# Patient Record
Sex: Female | Born: 1935 | Race: White | Hispanic: No
Health system: Southern US, Community
[De-identification: ages and names within clinical notes are randomized; demographics above are authoritative.]

---

## 2007-01-29 ENCOUNTER — Ambulatory Visit: Payer: Self-pay | Admitting: Internal Medicine

## 2007-01-29 ENCOUNTER — Ambulatory Visit: Payer: Self-pay

## 2007-02-02 ENCOUNTER — Ambulatory Visit: Payer: Self-pay | Admitting: Internal Medicine

## 2007-05-02 ENCOUNTER — Ambulatory Visit: Payer: Self-pay | Admitting: Unknown Physician Specialty

## 2007-09-28 ENCOUNTER — Ambulatory Visit: Payer: Self-pay | Admitting: Pain Medicine

## 2007-10-13 ENCOUNTER — Ambulatory Visit: Payer: Self-pay | Admitting: Pain Medicine

## 2007-10-27 ENCOUNTER — Ambulatory Visit: Payer: Self-pay | Admitting: Physician Assistant

## 2007-12-02 ENCOUNTER — Ambulatory Visit: Payer: Self-pay | Admitting: Family Medicine

## 2008-01-29 ENCOUNTER — Ambulatory Visit: Payer: Self-pay | Admitting: Cardiology

## 2008-03-25 ENCOUNTER — Ambulatory Visit: Payer: Self-pay | Admitting: Unknown Physician Specialty

## 2008-04-19 ENCOUNTER — Ambulatory Visit: Payer: Self-pay | Admitting: Physician Assistant

## 2008-04-28 ENCOUNTER — Ambulatory Visit: Payer: Self-pay | Admitting: Pain Medicine

## 2008-05-16 ENCOUNTER — Ambulatory Visit: Payer: Self-pay | Admitting: Pain Medicine

## 2008-09-06 ENCOUNTER — Ambulatory Visit: Payer: Self-pay | Admitting: Pain Medicine

## 2008-10-07 ENCOUNTER — Ambulatory Visit: Payer: Self-pay | Admitting: Family Medicine

## 2008-10-24 ENCOUNTER — Ambulatory Visit: Payer: Self-pay | Admitting: Family Medicine

## 2009-01-30 ENCOUNTER — Ambulatory Visit: Payer: Self-pay | Admitting: Family Medicine

## 2009-09-26 ENCOUNTER — Ambulatory Visit: Payer: Self-pay | Admitting: Family Medicine

## 2010-02-09 ENCOUNTER — Ambulatory Visit: Payer: Self-pay | Admitting: Family Medicine

## 2010-08-06 ENCOUNTER — Ambulatory Visit: Payer: Self-pay | Admitting: Gastroenterology

## 2010-08-09 LAB — PATHOLOGY REPORT

## 2010-09-11 ENCOUNTER — Ambulatory Visit: Payer: Self-pay | Admitting: Gastroenterology

## 2010-10-11 ENCOUNTER — Ambulatory Visit: Payer: Self-pay | Admitting: Surgery

## 2010-10-18 ENCOUNTER — Ambulatory Visit: Payer: Self-pay | Admitting: Surgery

## 2010-10-22 LAB — PATHOLOGY REPORT

## 2011-03-05 ENCOUNTER — Ambulatory Visit: Payer: Self-pay | Admitting: Family Medicine

## 2012-11-11 ENCOUNTER — Ambulatory Visit: Payer: Self-pay | Admitting: Family Medicine

## 2012-11-24 ENCOUNTER — Inpatient Hospital Stay: Payer: Self-pay | Admitting: Internal Medicine

## 2012-11-24 LAB — CBC WITH DIFFERENTIAL/PLATELET
Basophil #: 0 10*3/uL (ref 0.0–0.1)
Eosinophil #: 0 10*3/uL (ref 0.0–0.7)
Eosinophil %: 0 %
HCT: 39.1 % (ref 35.0–47.0)
Lymphocyte #: 0.8 10*3/uL — ABNORMAL LOW (ref 1.0–3.6)
Lymphocyte %: 8.4 %
MCH: 32.3 pg (ref 26.0–34.0)
MCHC: 34.5 g/dL (ref 32.0–36.0)
MCV: 94 fL (ref 80–100)
Monocyte #: 1 x10 3/mm — ABNORMAL HIGH (ref 0.2–0.9)
Neutrophil #: 7.4 10*3/uL — ABNORMAL HIGH (ref 1.4–6.5)
RBC: 4.18 10*6/uL (ref 3.80–5.20)
RDW: 13 % (ref 11.5–14.5)
WBC: 9.2 10*3/uL (ref 3.6–11.0)

## 2012-11-24 LAB — COMPREHENSIVE METABOLIC PANEL
Anion Gap: 12 (ref 7–16)
BUN: 26 mg/dL — ABNORMAL HIGH (ref 7–18)
Bilirubin,Total: 0.4 mg/dL (ref 0.2–1.0)
Calcium, Total: 9.2 mg/dL (ref 8.5–10.1)
Chloride: 101 mmol/L (ref 98–107)
EGFR (African American): 27 — ABNORMAL LOW
Potassium: 3.6 mmol/L (ref 3.5–5.1)
SGOT(AST): 22 U/L (ref 15–37)
Total Protein: 8.1 g/dL (ref 6.4–8.2)

## 2012-11-24 LAB — URINALYSIS, COMPLETE
Bilirubin,UR: NEGATIVE
Glucose,UR: NEGATIVE mg/dL (ref 0–75)
Hyaline Cast: 11
Leukocyte Esterase: NEGATIVE
Nitrite: NEGATIVE
Ph: 5 (ref 4.5–8.0)
RBC,UR: 2 /HPF (ref 0–5)
Specific Gravity: 1.019 (ref 1.003–1.030)
Squamous Epithelial: 2

## 2012-11-24 LAB — TROPONIN I: Troponin-I: <0.02 ng/mL

## 2012-11-24 LAB — LIPASE, BLOOD: Lipase: 86 U/L (ref 73–393)

## 2012-11-24 LAB — CLOSTRIDIUM DIFFICILE(ARMC)

## 2012-11-25 LAB — CBC WITH DIFFERENTIAL/PLATELET
Basophil #: 0 10*3/uL (ref 0.0–0.1)
Basophil %: 0.2 %
HGB: 11.7 g/dL — ABNORMAL LOW (ref 12.0–16.0)
Lymphocyte #: 0.9 10*3/uL — ABNORMAL LOW (ref 1.0–3.6)
Lymphocyte %: 12.3 %
MCH: 32.6 pg (ref 26.0–34.0)
MCHC: 35 g/dL (ref 32.0–36.0)
MCV: 93 fL (ref 80–100)
Monocyte #: 1 x10 3/mm — ABNORMAL HIGH (ref 0.2–0.9)
Neutrophil #: 5.4 10*3/uL (ref 1.4–6.5)
Neutrophil %: 73.6 %
Platelet: 236 10*3/uL (ref 150–440)
RBC: 3.6 10*6/uL — ABNORMAL LOW (ref 3.80–5.20)
RDW: 12.8 % (ref 11.5–14.5)
WBC: 7.4 10*3/uL (ref 3.6–11.0)

## 2012-11-25 LAB — BASIC METABOLIC PANEL
Anion Gap: 5 — ABNORMAL LOW (ref 7–16)
BUN: 20 mg/dL — ABNORMAL HIGH (ref 7–18)
Calcium, Total: 8.1 mg/dL — ABNORMAL LOW (ref 8.5–10.1)
Co2: 22 mmol/L (ref 21–32)
EGFR (African American): 44 — ABNORMAL LOW
EGFR (Non-African Amer.): 38 — ABNORMAL LOW
Glucose: 105 mg/dL — ABNORMAL HIGH (ref 65–99)
Osmolality: 279 (ref 275–301)

## 2012-11-25 LAB — URINE CULTURE

## 2012-11-26 LAB — BASIC METABOLIC PANEL
BUN: 10 mg/dL (ref 7–18)
Calcium, Total: 7.9 mg/dL — ABNORMAL LOW (ref 8.5–10.1)
Chloride: 114 mmol/L — ABNORMAL HIGH (ref 98–107)
Co2: 19 mmol/L — ABNORMAL LOW (ref 21–32)
EGFR (African American): 50 — ABNORMAL LOW
Glucose: 101 mg/dL — ABNORMAL HIGH (ref 65–99)
Osmolality: 280 (ref 275–301)
Potassium: 3.3 mmol/L — ABNORMAL LOW (ref 3.5–5.1)

## 2012-11-26 LAB — MAGNESIUM: Magnesium: 1.9 mg/dL

## 2012-11-27 LAB — BASIC METABOLIC PANEL
Anion Gap: 6 — ABNORMAL LOW (ref 7–16)
BUN: 8 mg/dL (ref 7–18)
Calcium, Total: 7.5 mg/dL — ABNORMAL LOW (ref 8.5–10.1)
Chloride: 117 mmol/L — ABNORMAL HIGH (ref 98–107)
Co2: 19 mmol/L — ABNORMAL LOW (ref 21–32)
Creatinine: 1 mg/dL (ref 0.60–1.30)
EGFR (African American): >60
Glucose: 97 mg/dL (ref 65–99)

## 2012-11-27 LAB — MAGNESIUM: Magnesium: 1.4 mg/dL — ABNORMAL LOW

## 2012-12-25 LAB — STOOL CULTURE

## 2013-06-06 IMAGING — NM NUCLEAR MEDICINE HEPATOHBILIARY INCLUDE GB
1 series · 9 of 9 positions shown · non-contrast
Comparison: none

REASON FOR EXAM: nausea  RUQ pain
COMMENTS:

[Series 1000: gallbladder statics · 4.80mm/px · 9 of 9 slices shown]
[im 1/9]
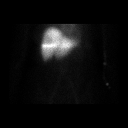
[im 2/9]
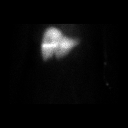
[im 3/9]
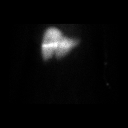
[im 4/9]
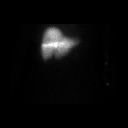
[im 5/9]
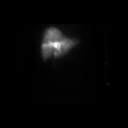
[im 6/9]
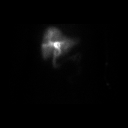
[im 7/9]
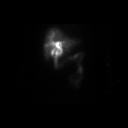
[im 8/9]
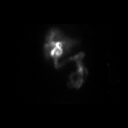
[im 9/9]
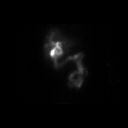

[9 of 9 positions shown; findings below may reference images not displayed]

PROCEDURE:     NM  - NM HEPATO WITH GB EJECT FRACTION  - September 11, 2010 [DATE]

RESULT:     Following intravenous administration of 8.36 mCi technetium 99m
Choletec, there is noted prompt visualization of tracer activity in the
liver at 3 minutes. Tracer activity is visualized in the gallbladder, common
duct and proximal small bowel at 40 minutes.
IMPRESSION: Normal hepatobiliary scan.

## 2013-12-15 ENCOUNTER — Ambulatory Visit: Payer: Self-pay | Admitting: Family Medicine

## 2014-05-06 NOTE — H&P (Signed)
PATIENT NAME:  Jill Green, Jill Green MR#:  [id number] DATE OF BIRTH:  06-27-1933  DATE OF ADMISSION:  11/24/2012  REFERRING PHYSICIAN: Rockne MenghiniAnne-Caroline Norman, MD  PRIMARY CARE PHYSICIAN: Jerl MinaJames Hedrick, MD  CHIEF COMPLAINT: Diarrhea and dehydration.   HISTORY OF PRESENT ILLNESS: The patient is a 81 year old female with past medical history of hypertension, hypothyroid and migraine in remote past who is living active and healthy life, retired, lives alone. Started having severe diarrhea 3 days ago and also associated with nausea and vomiting. The diarrhea is so severe that she said every 30 minutes she has to go to the bathroom and she also could not control it so she messed up her clothes and the bed. She is not able to eat anything for the last 2 days, not even the medication, Imodium, for her diarrhea, and so decided to come to Emergency Room today. In the ER, initially she was found having severe dehydration and acute renal failure so IV fluids were started. Her stool was checked for any C. diff, which was negative, so she is given as admission for severe dehydration, acute renal failure and diarrhea.   REVIEW OF SYSTEMS: CONSTITUTIONAL: Negative for fever and fatigue, but positive for generalized weakness. No pain or weight loss.  EYES: No blurring or double vision, discharge or redness.  EARS, NOSE, THROAT: No tinnitus, ear pain or hearing loss.  RESPIRATORY: No cough, wheezing, shortness of breath or hemoptysis.  CARDIOVASCULAR: No chest pain, orthopnea, edema or palpitations.  GASTROINTESTINAL: The patient has severe nausea, vomiting, diarrhea and abdominal discomfort, but no pain.  GENITOURINARY: The patient has had decreased urination for the last 2 days now and denies any increased frequency of burning in the urine.  SKIN: No rashes or lesions.  LEGS: No swelling.  NEUROLOGICAL: Denies any weakness, numbness or tremors.  PSYCHIATRIC: No insomnia or depression.  PAST MEDICAL HISTORY: Positive  for hypertension and hypothyroid.  PAST SURGICAL HISTORY: Two years ago gallbladder surgery which was laparoscopic and vaginal hysterectomy.   [place] and she said that her PMD has the report and there was not any significant finding.   SOCIAL HISTORY: Quit smoking 25 years ago. Drinks alcohol occasionally. No drugs. She is retired and lives independently in a condominium.  FAMILY HISTORY: Sister had some kidney cancer and brothers and sisters, some of them died. One of the brothers had some heart attacks at an early age.   PHYSICAL EXAMINATION: VITAL SIGNS: In the ER, temperature 98, pulse 100, respirations 18, blood pressure 106/63 and pulse ox 96 on room air.  GENERAL: The patient is fully alert and oriented to time, place and person. Does not appear in any acute distress.  HEENT: Head and neck atraumatic. Conjunctivae pink. Oral mucosa moist.  NECK: Supple. No JVD.  RESPIRATORY: Bilateral clear and equal air entry.  CARDIOVASCULAR: S1 and S2 present, regular. No murmur.  ABDOMEN: Soft, nontender. Bowel sounds present. No organomegaly. Mild discomfort present.  SKIN: No rashes.  LEGS: No edema.  NEUROLOGICAL: Power 5/5.  Follows commands. No tremor or rigidity.  PSYCHIATRIC: Does not appear in any acute psychiatric illness.   IMPORTANT LABORATORY AND DIAGNOSTIC RESULTS: A CT scan of the abdomen and pelvis shows negative for urinary tract stone. No acute finding or finding to explain the patient's symptoms. Simple hepatic cyst. Status post hysterectomy and cholecystectomy.  Glucose 111, BUN 26, creatinine 1.99, sodium 136, potassium 3.6, chloride 101 and CO2 23. Lipase is 86. Troponin  is less than 0.02. WBC 9.2, hemoglobin 13.5, platelet count 287. C. diff is negative. Urinalysis is grossly negative.   ASSESSMENT AND PLAN:  81.  A 81 year old female with past history of hypertension and hypothyroid who came to the Emergency Room with complaint of  severe diarrhea and intractable vomiting. Will give her IV Cipro and Flagyl and will do symptomatic management for vomiting and diarrhea. Will also call gastroenterology consult and will do stool culture. Clostridium difficile is negative.  2.  Dehydration and acute renal failure. We will give IV fluid and continue monitoring.  3.  Hypertension. Will hold blood pressure medication at this time because blood pressure is normal.  4.  Hypothyroidism. Will continue her Synthroid as she was taking and check TSH tomorrow morning.  TOTAL TIME SPENT ON THIS ADMISSION: 45 minutes. ____________________________ Hope Pigeon Elisabeth Pigeon, MD vgv:sb D: 11/24/2012 16:24:48 ET T: 11/24/2012 16:51:39 ET JOB#: [id number]  cc: Hope Pigeon. Elisabeth Pigeon, MD, <Dictator> Rhona Leavens. Burnett Sheng, MD Altamese Dilling MD ELECTRONICALLY SIGNED 12/14/2012 10:08

## 2014-05-06 NOTE — Discharge Summary (Signed)
PATIENT NAME:  Jill Green, Jill Green MR#:  [id number] DATE OF BIRTH:  [birth year]  DATE OF ADMISSION:  11/24/2012 DATE OF DISCHARGE:  11/27/2012  PRIMARY CARE PHYSICIAN: Rhona LeavensJames F. Burnett ShengHedrick, MD  DISCHARGE DIAGNOSES: 1.  Acute gastroenteritis, Salmonella infection.  2.  Acute renal failure.  3.  Dehydration.  4.  Hypokalemia.  5.  Hypomagnesemia.  6.  Hypertension.   CONDITION: Stable.   CODE STATUS: Full code.   HOME MEDICATIONS: Please refer to the [facility] physician discharge instruction medication reconciliation list.   DIET: Low-sodium diet.   ACTIVITY: As tolerated.   FOLLOWUP CARE: Follow up with PCP within 1 to 2 weeks.   REASON FOR ADMISSION: Diarrhea and dehydration.   HOSPITAL COURSE: The patient is a 81 year old Caucasian female with a history of hypertension, migraine, hypothyroidism, who presented to the ED with diarrhea and dehydration for 2 days. For detailed history and physical examination, please refer to the admission note dictated by Dr. Elisabeth PigeonVachhani. Laboratory data on admission date showed BUN 26, creatinine 1.99, sodium 136, potassium 3.6, chloride 101, lipase 86. Urinalysis is negative. 1.  Acute gastroenteritis: After admission, the patient has been treated with Cipro and Flagyl. In addition, the patient has been treated with aggressive IV fluid support. The patient's stool C. difficile was sent, which was negative for C. difficile, but stool culture came back showing Salmonella bacteria. The patient had multiple diarrhea since admission, however, no diarrhea today. The patient's symptoms have much improved. No nausea, vomiting or abdominal pain, but only feels weak. The patient's blood pressure is stable.  2.  For acute renal failure and dehydration, after aggressive IV fluid support, the patient's renal function became normal.  3.  Hypertension: Since the patient's blood pressure was on the low side, blood pressure medication was on hold. Will resume after discharge.   The  patient is clinically stable. Her vital signs are stable. She will be discharged to home today. I discussed the patient's discharge plan with the patient, the case manager and nurse.   TIME SPENT: About 35 minutes.   ____________________________ Shaune PollackQing Idy Rawling, MD qc:jcm D: 11/27/2012 14:25:36 ET T: 11/27/2012 15:38:26 ET JOB#: [id number]  cc: Shaune PollackQing Toria Monte, MD, <Dictator> Shaune PollackQING Eretria Manternach MD ELECTRONICALLY SIGNED 12/02/2012 15:40

## 2014-05-06 NOTE — Consult Note (Signed)
PATIENT NAME:  Jill Green, Jill Green MR#:  [id number] DATE OF BIRTH:  Sep 15, 1933  DATE OF CONSULTATION:  11/24/2012  REFERRING PHYSICIAN:  Dr. Elisabeth Pigeon.  CONSULTING PHYSICIAN:  Dow Adolph, MD  REASON FOR THE CONSULT: Diarrhea, nausea, vomiting.   HISTORY OF PRESENT ILLNESS: Jill Green is a 81 year old female with a past medical history notable for hypertension and hypothyroid, who presented to the Emergency Room today for evaluation of diarrhea, nausea, vomiting.  Jill Green reports that she was in her usual state of health until Sunday, which would have been 3 days ago. At that time, she developed pretty sudden onset of very frequent diarrhea and also nausea and vomiting. She tried to tough it out at home but was unable to keep any liquids. Therefore, she decided to present to the Emergency Room today.   In the Emergency Room, she was found to have an elevated creatinine and other signs of dehydration. She was given IV fluids and was admitted to the hospital for further management.   [name] denies any sick contacts or any abnormal or raw foods that she has eaten prior to this illness. She did have some very mild streaks of blood in her diarrhea, which she attributes to  hemorrhoids, but otherwise has not had any blood in her diarrhea. She has also not had any blood in her vomitus. She has not had a similar episode to this.   PAST MEDICAL HISTORY: 1.  Hypertension.  2.  Hypothyroid.   PAST SURGICAL HISTORY: Cholecystectomy 2 years ago, and she has also had a hysterectomy.   SOCIAL HISTORY: She denies tobacco, alcohol or recreational drugs.   FAMILY HISTORY: No family history of colon cancer that she is aware of.   REVIEW OF SYSTEMS:   CONSTITUTIONAL: No weight gain or weight loss.  No fever or chills.  Positive for fatigue, weakness, dehydration.   HEENT: No oral lesions or sore throat. No vision changes. GASTROINTESTINAL: See HPI.  HEME/LYMPH: No easy bruising or  bleeding. CARDIOVASCULAR: No chest pain or dyspnea on exertion. GENITOURINARY: No hematuria. INTEGUMENTARY: No rashes or pruritus PSYCHIATRIC: No depression/anxiety.  ENDOCRINE: No heat/cold intolerance, no hair loss or skin changes. ALLERGIC/IMMUNOLOGIC: Negative for hives. RESPIRATORY: No cough, no shortness of breath.  MUSCULOSKELETAL: No joint swelling or muscle pain.  PHYSICAL EXAMINATION: VITAL SIGNS: Temperature 98.2, pulse is 83 respirations are 16, blood pressure 137/74, pulse ox is 99% on room air.  GENERAL: Alert and oriented x 4.  No acute distress. Appears stated age.   Lying in bed wearing a diaper. HEENT: Normocephalic/atraumatic. Extraocular movements are intact. Anicteric. NECK: Soft, supple. JVP appears normal. No adenopathy. CHEST: Clear to auscultation. No wheeze or crackle. Respirations unlabored. HEART: Regular. No murmur, rub, or gallop.  Normal S1 and S2. ABDOMEN: Soft. Mild diffuse tenderness.  Nondistended.  Normal active bowel sounds in all four quadrants.  No organomegaly. No masses EXTREMITIES: No swelling, well perfused. SKIN: No rash or lesion. Skin color, texture, turgor normal. NEUROLOGICAL: Grossly intact. PSYCHIATRIC: Normal tone and affect. MUSCULOSKELETAL: No joint swelling or erythema.   LABORATORY DATA: Sodium 136, potassium 3.6, BUN 26, creatinine 1.99. Her lipase is 86. Albumin 3.8. Liver enzymes normal. Cardiac enzymes normal. White count 9.2, hemoglobin 13.5. Hematocrit is 39. Platelet count is 287. Her C. diff is negative.   ASSESSMENT: Diarrhea, nausea and vomiting: This has only been going on for several days and was acute in onset. I do strongly suspect that this is just a gastroenteritis. Unfortunately, Jill Green  did become dehydrated, and therefore the Emergency Room visit and the hospitalization were necessary.   She is feeling much better now, between the nausea medicine and the IV fluid, and possibly the antibiotics.   PLAN: I would  recommend conservative treatment at this time. There is no indication for upper endoscopy, colonoscopy or imaging at this time. Would continue IV fluids and antinausea medicines as you are doing. Suspect she will continue to improve dramatically from this point. We will continue to follow.   Thank you for this consult.   ____________________________ Dow AdolphMatthew Boe Deans, MD mr:dmm D: 11/24/2012 19:19:51 ET T: 11/24/2012 19:44:32 ET JOB#: [id number]  cc: Dow AdolphMatthew Amaziah Ghosh, MD, <Dictator> Kathalene FramesMATTHEW G Mairead Schwarzkopf MD ELECTRONICALLY SIGNED 11/29/2012 19:47

## 2015-08-20 IMAGING — CT CT ABD-PELV W/O CM
2 of 4 series · 16 of 46 positions shown, 18 images · non-contrast
Comparison: None.

CLINICAL DATA: Right flank and right lower quadrant pain.

EXAM:
CT ABDOMEN AND PELVIS WITHOUT CONTRAST
TECHNIQUE: Multidetector CT imaging of the abdomen and pelvis was performed
following the standard protocol without intravenous contrast.

[Series 2: stone standard full · axial · 0.60mm/px · z∈[-798,-413]mm · 13 of 85 slices shown, 15 images]
[im 4/85  soft-tissue]
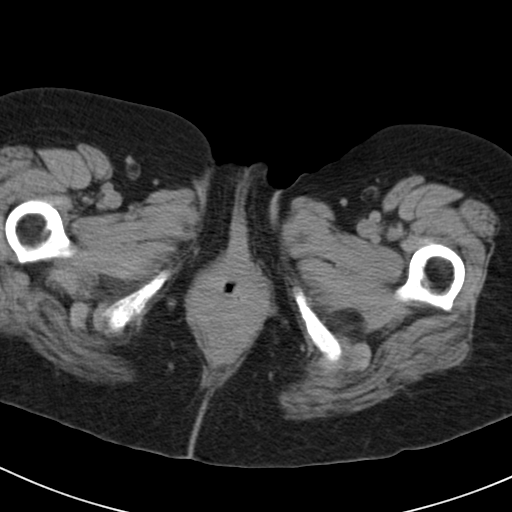
[im 4/85  bone]
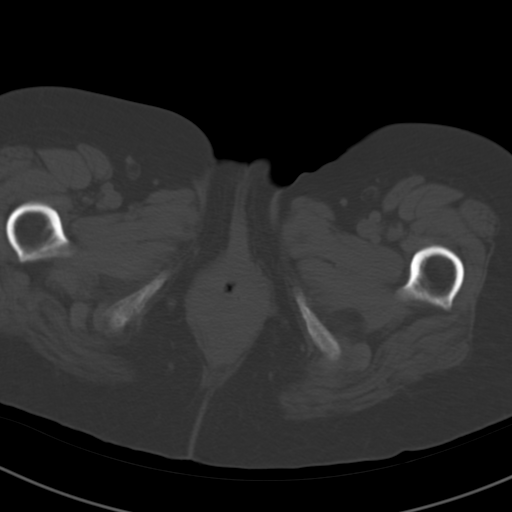
[im 11/85  soft-tissue]
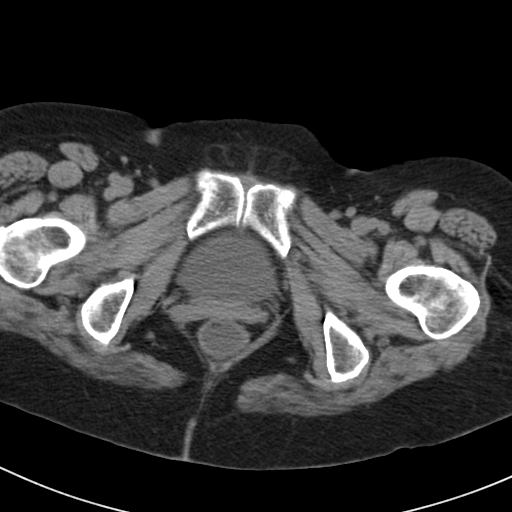
[im 17/85  soft-tissue]
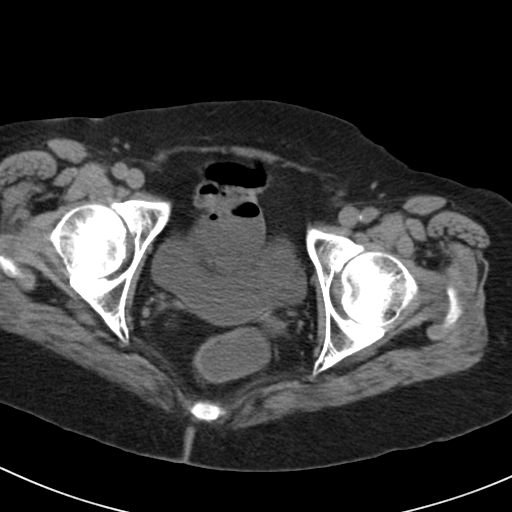
[im 24/85  soft-tissue]
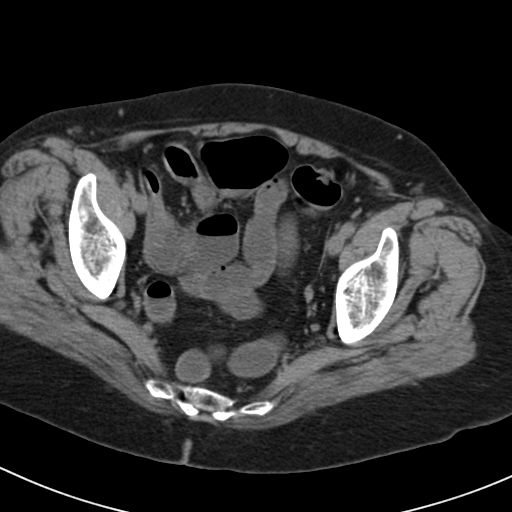
[im 31/85  soft-tissue]
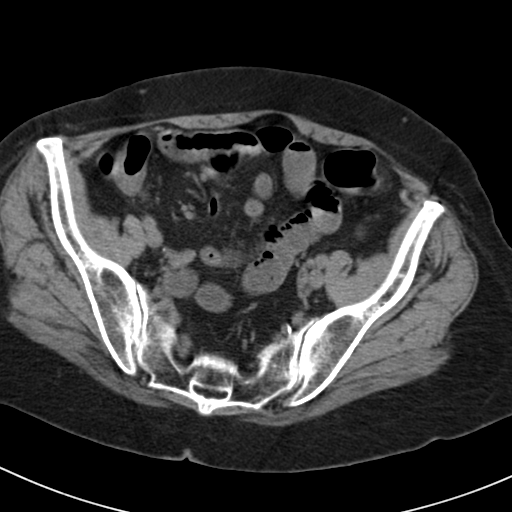
[im 37/85  soft-tissue]
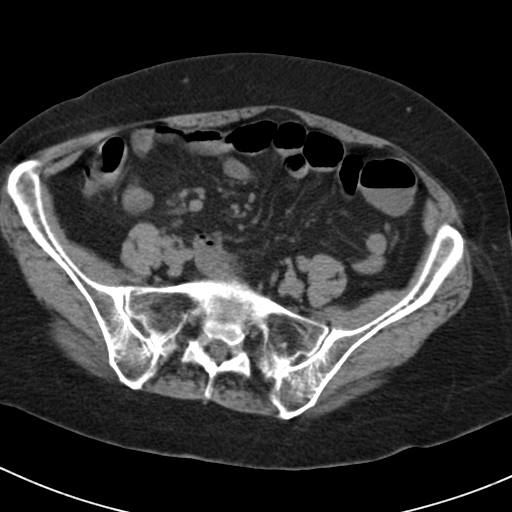
[im 44/85  soft-tissue]
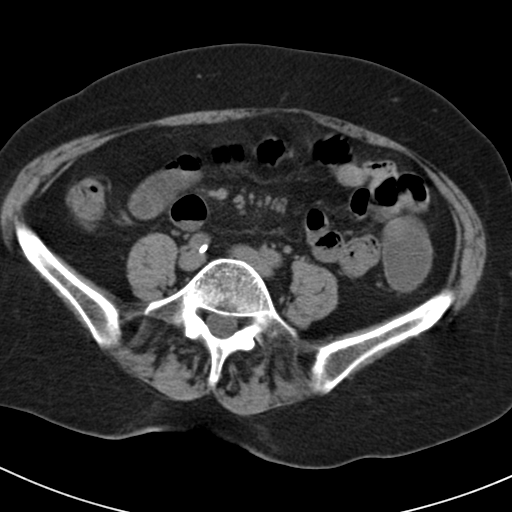
[im 48/85  soft-tissue]
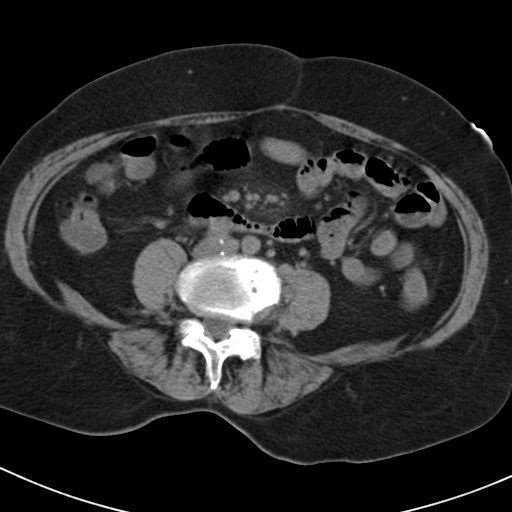
[im 54/85  soft-tissue]
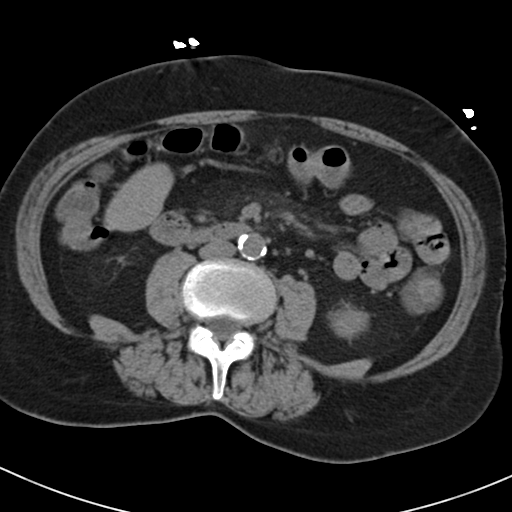
[im 54/85  bone]
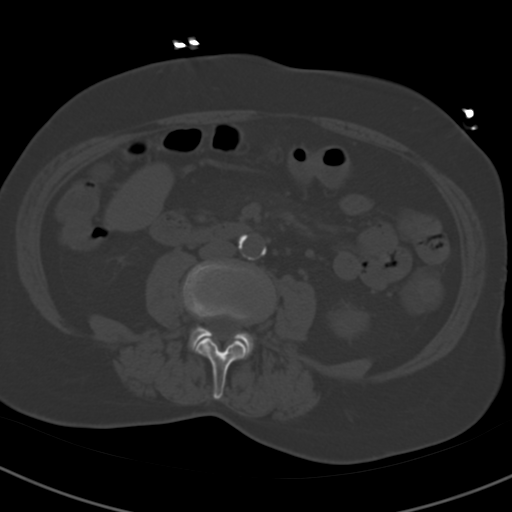
[im 61/85  soft-tissue]
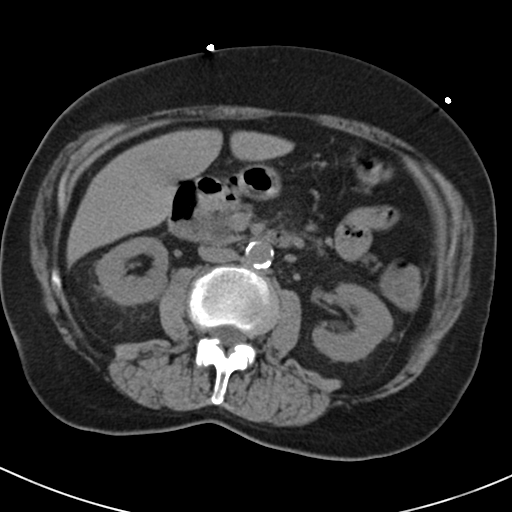
[im 68/85  soft-tissue]
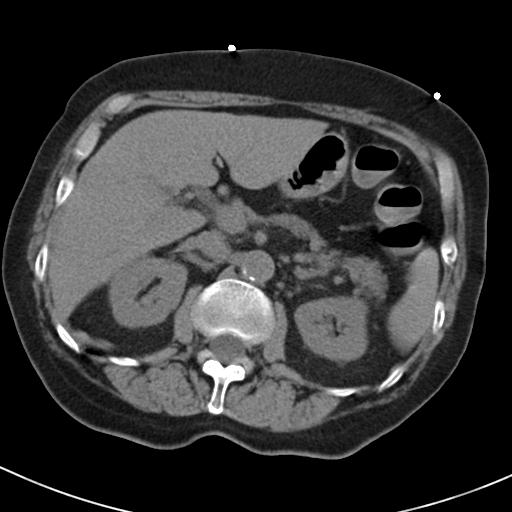
[im 74/85  soft-tissue]
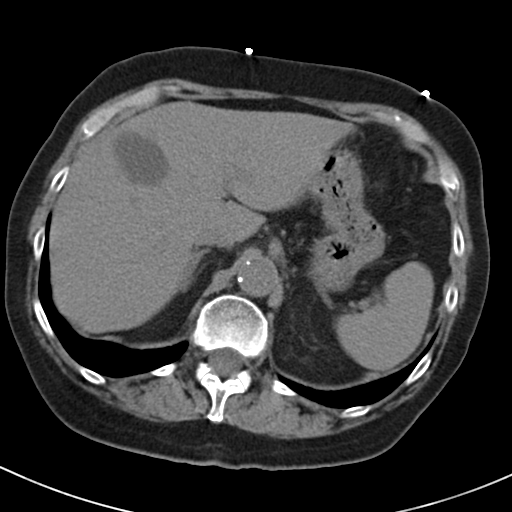
[im 81/85  soft-tissue]
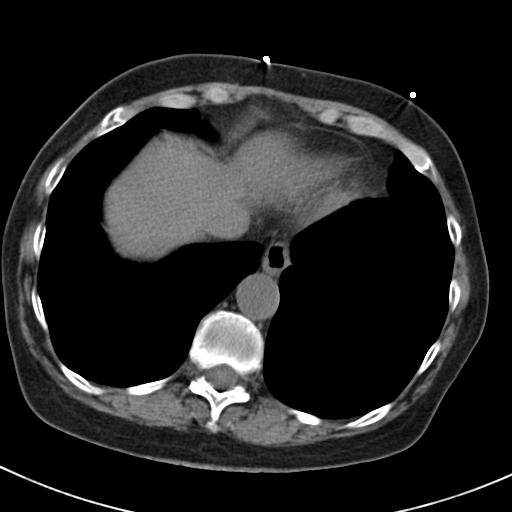

[Series 5: cor stone standard full · coronal · 0.67mm/px · 3 of 105 slices shown]
[im 35/105  soft-tissue]
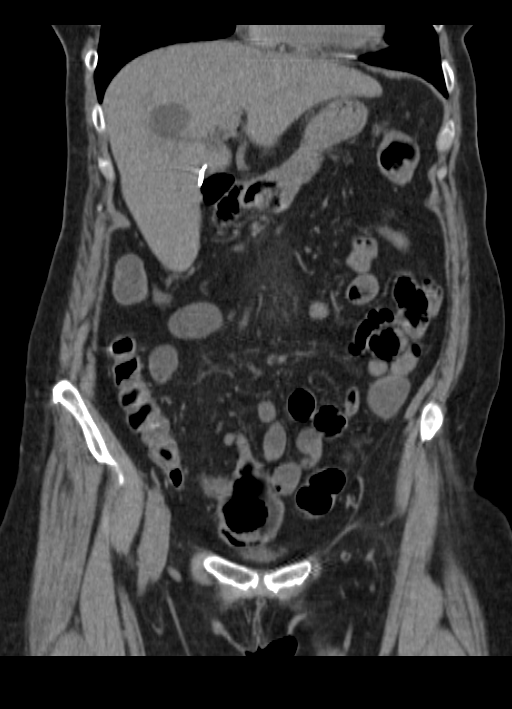
[im 47/105  soft-tissue]
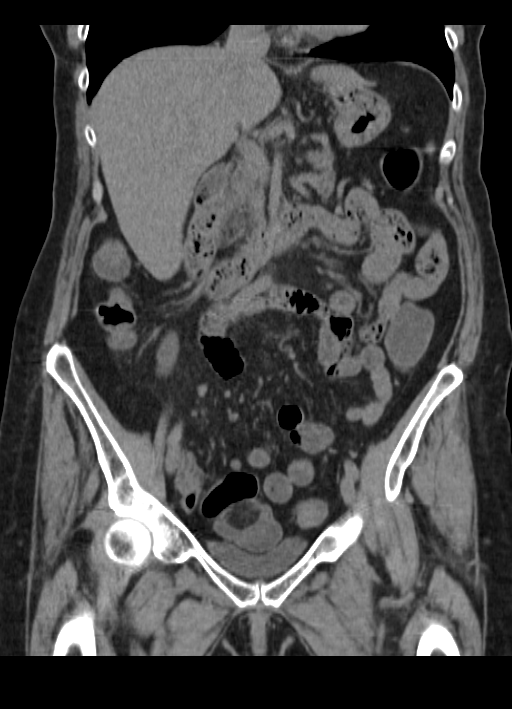
[im 58/105  soft-tissue]
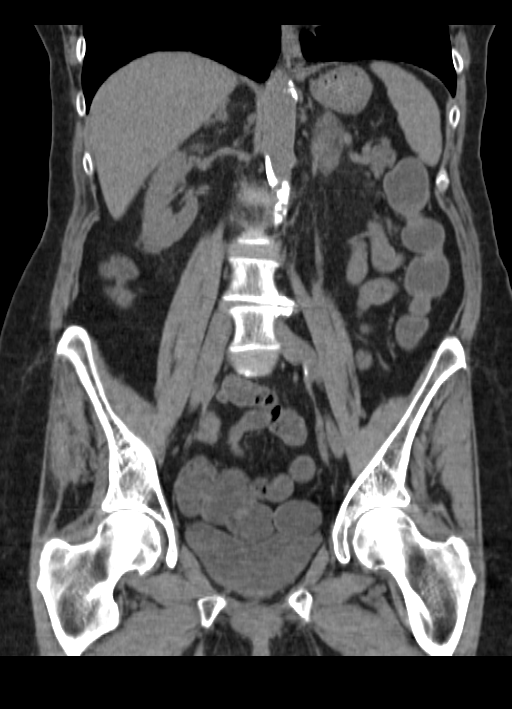

[16 of 46 positions shown; findings below may reference images not displayed]

FINDINGS: The lung bases are clear. No pleural or pericardial effusion.

The patient is status post cholecystectomy. A simple 4.5 cm in
diameter cyst is noted in the liver. The liver is otherwise
unremarkable. The adrenal glands, spleen and pancreas appear normal.
No renal or ureteral stones are identified. The kidneys have a
normal uninfused appearance without hydronephrosis. The patient is
status post hysterectomy. The stomach and small and large bowel
appear normal. The appendix is unremarkable. No lymphadenopathy or
fluid is identified. No focal bony abnormality is seen.
IMPRESSION: Negative for urinary tract stone. No acute finding or finding to
explain the patient's symptoms.

Simple hepatic cyst.

Status post hysterectomy and cholecystectomy.
